# Patient Record
Sex: Male | Born: 1950 | Race: White | Hispanic: No | Marital: Married | Smoking: Never smoker
Health system: Southern US, Community
[De-identification: ages and names within clinical notes are randomized; demographics above are authoritative.]

## PROBLEM LIST (undated history)

## (undated) DIAGNOSIS — I4892 Unspecified atrial flutter: Secondary | ICD-10-CM

---

## 2009-03-10 ENCOUNTER — Ambulatory Visit: Payer: Self-pay | Admitting: Gastroenterology

## 2015-02-24 ENCOUNTER — Ambulatory Visit (INDEPENDENT_AMBULATORY_CARE_PROVIDER_SITE_OTHER): Payer: BLUE CROSS/BLUE SHIELD

## 2015-02-24 ENCOUNTER — Ambulatory Visit (INDEPENDENT_AMBULATORY_CARE_PROVIDER_SITE_OTHER): Payer: BLUE CROSS/BLUE SHIELD | Admitting: Family Medicine

## 2015-02-24 VITALS — BP 130/90 | HR 100 | Temp 99.3°F | Resp 16 | Ht 73.0 in | Wt 240.0 lb

## 2015-02-24 DIAGNOSIS — R062 Wheezing: Secondary | ICD-10-CM | POA: Diagnosis not present

## 2015-02-24 DIAGNOSIS — R0601 Orthopnea: Secondary | ICD-10-CM

## 2015-02-24 DIAGNOSIS — I4891 Unspecified atrial fibrillation: Secondary | ICD-10-CM | POA: Diagnosis not present

## 2015-02-24 DIAGNOSIS — I499 Cardiac arrhythmia, unspecified: Secondary | ICD-10-CM

## 2015-02-24 DIAGNOSIS — R059 Cough, unspecified: Secondary | ICD-10-CM

## 2015-02-24 DIAGNOSIS — R05 Cough: Secondary | ICD-10-CM | POA: Diagnosis not present

## 2015-02-24 DIAGNOSIS — J069 Acute upper respiratory infection, unspecified: Secondary | ICD-10-CM

## 2015-02-24 DIAGNOSIS — J189 Pneumonia, unspecified organism: Secondary | ICD-10-CM | POA: Diagnosis not present

## 2015-02-24 LAB — POCT CBC
Granulocyte percent: 75.1 %G (ref 37–80)
HCT, POC: 46.7 % (ref 43.5–53.7)
HEMOGLOBIN: 15.5 g/dL (ref 14.1–18.1)
Lymph, poc: 1.3 (ref 0.6–3.4)
MCH, POC: 32.2 pg — AB (ref 27–31.2)
MCHC: 33.3 g/dL (ref 31.8–35.4)
MCV: 96.9 fL (ref 80–97)
MID (cbc): 0.3 (ref 0–0.9)
MPV: 6.5 fL (ref 0–99.8)
PLATELET COUNT, POC: 220 10*3/uL (ref 142–424)
POC Granulocyte: 5 (ref 2–6.9)
POC LYMPH PERCENT: 19.7 %L (ref 10–50)
POC MID %: 5.2 % (ref 0–12)
RBC: 4.82 M/uL (ref 4.69–6.13)
RDW, POC: 14.2 %
WBC: 6.6 10*3/uL (ref 4.6–10.2)

## 2015-02-24 MED ORDER — ALBUTEROL SULFATE (2.5 MG/3ML) 0.083% IN NEBU
2.5000 mg | INHALATION_SOLUTION | Freq: Once | RESPIRATORY_TRACT | Status: DC
Start: 2015-02-24 — End: 2015-02-24

## 2015-02-24 MED ORDER — HYDROCODONE-HOMATROPINE 5-1.5 MG/5ML PO SYRP: 5.0000 mL | ORAL_SOLUTION | Freq: Three times a day (TID) | ORAL | Status: DC | PRN

## 2015-02-24 MED ORDER — IPRATROPIUM BROMIDE 0.02 % IN SOLN: 0.5000 mg | Freq: Once | RESPIRATORY_TRACT | Status: DC

## 2015-02-24 MED ORDER — ASPIRIN EC 325 MG PO TBEC: 325.0000 mg | DELAYED_RELEASE_TABLET | Freq: Every day | ORAL | Status: DC

## 2015-02-24 MED ORDER — AZITHROMYCIN 500 MG PO TABS: 500.0000 mg | ORAL_TABLET | Freq: Every day | ORAL | Status: DC

## 2015-02-24 NOTE — Progress Notes (Signed)
Subjective:  This chart was scribed for Chase Sorenson, MD by Charline Bills, ED Scribe. The patient was seen in room 12. Patient's care was started at 8:43 AM.   Patient ID: [name], male    DOB: 1951-03-16, 64 y.o.   MRN: [id number]  Chief Complaint  Patient presents with  . Cough    congestion x 10 days   HPI HPI Comments: Chase Kirk is a 64 y.o. male who presents to the [facility] complaining of persistent cough, both dry and productive, for the past 10 days. Pt reports associated sore throat esp at the beginning of his illness that he describes as "scratchy," in addition to nasal congestion and rhinorrhea with green, yellow and clear discharge and post-nasal drip. However, over the past week he has been having progressive wheezing with lying flat at night and difficulty sleeping due to symptoms. Pt has been treating with Mucinex tablets during the day and liquid at night for the past few days without any significant relief.   He denies fever, chills, loss of appetite, leg swelling, chest pain, palpitations, changes in bowels, urinary symptoms. No h/o asthma, smoking, sleep apnea, respiratory complications, or immunosuppressing illness. Pt states that he was told that he has a heart arrhythmia as a teenager and again just 4-5 years ago when he had a routine screening colonoscopy.  Pt reports that his mother-in-law was recently in the hospital with pseudomonas infection and returned home 3 days ago. Pt does not attribute his symptoms to her illness.     No PCP  Pt wants to minimize any medical interventions.  He would like to get his acute illness treated due to the severity and length of illness w/o improvement but does not want evaluation of any possible chronic medical illness or intervenstions for chronic disease including screening.  Pt was very reluctant to do the EKG I advised but ultimately agreed due to my persistence and concern. Will not take  ANY daily prescription medication on a long-term basis.  No past medical history on file.  No current outpatient prescriptions on file prior to visit.   No current facility-administered medications on file prior to visit.   No Known Allergies  Review of Systems  Constitutional: Positive for fatigue. Negative for fever, chills, activity change and appetite change.  HENT: Positive for congestion, postnasal drip, rhinorrhea and sore throat. Negative for facial swelling, sinus pressure and trouble swallowing.   Respiratory: Positive for shortness of breath and wheezing. Negative for chest tightness.   Cardiovascular: Negative for chest pain, palpitations and leg swelling.  Gastrointestinal: Negative for nausea, vomiting, abdominal pain, diarrhea, constipation and blood in stool.  Genitourinary: Negative.  Negative for decreased urine volume and difficulty urinating.  Skin: Negative for color change and rash.  Neurological: Negative for dizziness, syncope, facial asymmetry, speech difficulty, weakness, light-headedness and numbness.  Hematological: Negative for adenopathy.  Psychiatric/Behavioral: Positive for sleep disturbance. Negative for dysphoric mood. The patient is not nervous/anxious.      BP 130/90 mmHg  Pulse 100  Temp(Src) 99.3 F (37.4 C) (Oral)  Resp 16  Ht  (1.854 m)  Wt 240 lb (108.863 kg)  BMI 31.67 kg/m2  SpO2 98%    Objective:   Physical Exam  Constitutional: He is oriented to person, place, and time. He appears well-developed and well-nourished. No distress.  HENT:  Head: Normocephalic and atraumatic.  Nose: Nose normal.  Mouth/Throat: Posterior oropharyngeal erythema (mild) present.  Ears: cerumen  bilaterally  Mouth/Throat: clear postnasal drip  Eyes: Conjunctivae and EOM are normal.  Neck: Neck supple. No tracheal deviation present. No thyromegaly present.  Cardiovascular: Normal rate, S1 normal and S2 normal.  An irregularly irregular rhythm present.   Pulmonary/Chest: Effort normal and breath sounds normal. No respiratory distress. He has no wheezes.  Lungs are clear to auscultation. Good air movement. No wheezing with forced expiration.   Musculoskeletal: Normal range of motion. He exhibits no edema.  Lymphadenopathy:    He has no cervical adenopathy.  Neurological: He is alert and oriented to person, place, and time.  Skin: Skin is warm and dry.  Psychiatric: He has a normal mood and affect. His behavior is normal.  Nursing note and vitals reviewed.    [facility] reading (PRIMARY) by  Dr. Clelia Croft.  CXR: LLL infiltrate consistent with pneumonia. Borderline enlarged cardiac silhouette, question of some thoracic scoliosis Dg Chest 2 View  02/24/2015   CLINICAL DATA:  Ten days of chest congestion, history of atrial fibrillation and orthopnea, nonsmoker.  EXAM: CHEST  2 VIEW  COMPARISON:  None.  FINDINGS: The lungs are mildly hyperinflated but clear. The cardiac silhouette is enlarged. The pulmonary vascularity is normal. There is no pleural effusion or pneumothorax. The mediastinum is normal in width. The bony thorax is unremarkable.  IMPRESSION: COPD. There is no pneumonia nor other acute cardiopulmonary abnormality.   Electronically Signed   By: David  [place] M.D.   On: 02/24/2015 10:17    Results for orders placed or performed in visit on 02/24/15  POCT CBC  Result Value Ref Range   WBC 6.6 4.6 - 10.2 K/uL   Lymph, poc 1.3 0.6 - 3.4   POC LYMPH PERCENT 19.7 10 - 50 %L   MID (cbc) 0.3 0 - 0.9   POC MID % 5.2 0 - 12 %M   POC Granulocyte 5.0 2 - 6.9   Granulocyte percent 75.1 37 - 80 %G   RBC 4.82 4.69 - 6.13 M/uL   Hemoglobin 15.5 14.1 - 18.1 g/dL   HCT, POC 16.1 09.6 - 53.7 %   MCV 96.9 80 - 97 fL   MCH, POC 32.2 (A) 27 - 31.2 pg   MCHC 33.3 31.8 - 35.4 g/dL   RDW, POC 04.5 %   Platelet Count, POC 220 142 - 424 K/uL   MPV 6.5 0 - 99.8 fL    Assessment & Plan:   1. Cardiac arrhythmia, unspecified cardiac arrhythmia type   2. Wheeze    3. Cough   4. Acute upper respiratory infection   5. Atrial fibrillation, unspecified   6. Orthopnea   7. CAP (community acquired pneumonia)     Orders Placed This Encounter  Procedures  . DG Chest 2 View    Standing Status: Future     Number of Occurrences: 1     Standing Expiration Date: 03/03/2015    Order Specific Question:  Reason for Exam (SYMPTOM  OR DIAGNOSIS REQUIRED)    Answer:  new DOE on amiodarone    Order Specific Question:  Preferred imaging location?    Answer:  External  . POCT CBC  . EKG 12-Lead    Meds ordered this encounter  Medications  . DISCONTD: albuterol (PROVENTIL) (2.5 MG/3ML) 0.083% nebulizer solution 2.5 mg    Sig:   . DISCONTD: ipratropium (ATROVENT) nebulizer solution 0.5 mg    Sig:   . aspirin EC 325 MG tablet    Sig: Take 1 tablet (325 mg total)  by mouth daily.    Dispense:  325 tablet    Refill:  1  . azithromycin (ZITHROMAX) 500 MG tablet    Sig: Take 1 tablet (500 mg total) by mouth daily.    Dispense:  3 tablet    Refill:  0  . HYDROcodone-homatropine (HYCODAN) 5-1.5 MG/5ML syrup    Sig: Take 5 mLs by mouth every 8 (eight) hours as needed for cough.    Dispense:  120 mL    Refill:  0    Pt does not want to take daily prescription medications. I have fully informed him of risks of untreated atrial fibrillation including possible stroke with its variety of symptoms including dysphagia, hemiparesis, loss of bowel and bladder function, inability to communicate and death. Advised pt that this could be triggered by acute illness and use of cough and cold medications which would be of even more danger to him as result of illness and higher risk of paroxysmal atrial fibrillation to cause stroke. Pt absolutely refuses to take any daily prescription medications, even a very lose dose o fa once daily beta blocker which many patients have no side effects for and does not require any extra monitoring. Pt does consent to starting ASA daily but under no  conditions would consider prescription anticoagulation. He will not consider special consultation or outpatient cardiology evaluation. I explained that EKG also shows R bundle branch which leaves us unable to access current ischemia or history thereof. 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 He still asks that acute infection be treated.   I personally performed the services described in this documentation, which was scribed in my presence. The recorded information has been reviewed and considered, and addended by me as needed.  Chase SorensonEva Wylee Dorantes, MD MPH

## 2015-02-24 NOTE — Patient Instructions (Addendum)
You should stay away from all cough and cold medications containing decongestant, especially phenylephrine and pseudoephedrine (will be listed under the active ingredient list).    Atrial Fibrillation Atrial fibrillation is a type of irregular heart rhythm (arrhythmia). During atrial fibrillation, the upper chambers of the heart (atria) quiver continuously in a chaotic pattern. This causes an irregular and often rapid heart rate.  Atrial fibrillation is the result of the heart becoming overloaded with disorganized signals that tell it to beat. These signals are normally released one at a time by a part of the right atrium called the sinoatrial node. They then travel from the atria to the lower chambers of the heart (ventricles), causing the atria and ventricles to contract and pump blood as they pass. In atrial fibrillation, parts of the atria outside of the sinoatrial node also release these signals. This results in two problems. First, the atria receive so many signals that they do not have time to fully contract. Second, the ventricles, which can only receive one signal at a time, beat irregularly and out of rhythm with the atria.  There are three types of atrial fibrillation:   Paroxysmal. Paroxysmal atrial fibrillation starts suddenly and stops on its own within a week.  Persistent. Persistent atrial fibrillation lasts for more than a week. It may stop on its own or with treatment.  Permanent. Permanent atrial fibrillation does not go away. Episodes of atrial fibrillation may lead to permanent atrial fibrillation. Atrial fibrillation can prevent your heart from pumping blood normally. It increases your risk of stroke and can lead to heart failure.  CAUSES   Heart conditions, including a heart attack, heart failure, coronary artery disease, and heart valve conditions.   Inflammation of the sac that surrounds the heart (pericarditis).  Blockage of an artery in the lungs (pulmonary  embolism).  Pneumonia or other infections.  Chronic lung disease.  Thyroid problems, especially if the thyroid is overactive (hyperthyroidism).  Caffeine, excessive alcohol use, and use of some illegal drugs.   Use of some medicines, including certain decongestants and diet pills.  Heart surgery.   Birth defects.  Sometimes, no cause can be found. When this happens, the atrial fibrillation is called lone atrial fibrillation. The risk of complications from atrial fibrillation increases if you have lone atrial fibrillation and you are age 10 years or older. RISK FACTORS  Heart failure.  Coronary artery disease.  Diabetes mellitus.   High blood pressure (hypertension).   Obesity.   Other arrhythmias.   Increased age. SIGNS AND SYMPTOMS   A feeling that your heart is beating rapidly or irregularly.   A feeling of discomfort or pain in your chest.   Shortness of breath.   Sudden light-headedness or weakness.   Getting tired easily when exercising.   Urinating more often than normal (mainly when atrial fibrillation first begins).  In paroxysmal atrial fibrillation, symptoms may start and suddenly stop. DIAGNOSIS  Your health care provider may be able to detect atrial fibrillation when taking your pulse. Your health care provider may have you take a test called an ambulatory electrocardiogram (ECG). An ECG records your heartbeat patterns over a 24-hour period. You may also have other tests, such as:  Transthoracic echocardiogram (TTE). During echocardiography, sound waves are used to evaluate how blood flows through your heart.  Transesophageal echocardiogram (TEE).  Stress test. There is more than one type of stress test. If a stress test is needed, ask your health care provider about which type is best  for you.  Chest X-ray exam.  Blood tests.  Computed tomography (CT). TREATMENT  Treatment may include:  Treating any underlying conditions. For  example, if you have an overactive thyroid, treating the condition may correct atrial fibrillation.  Taking medicine. Medicines may be given to control a rapid heart rate or to prevent blood clots, heart failure, or a stroke.  Having a procedure to correct the rhythm of the heart:  Electrical cardioversion. During electrical cardioversion, a controlled, low-energy shock is delivered to the heart through your skin. If you have chest pain, very low blood pressure, or sudden heart failure, this procedure may need to be done as an emergency.  Catheter ablation. During this procedure, heart tissues that send the signals that cause atrial fibrillation are destroyed.  Surgical ablation. During this surgery, thin lines of heart tissue that carry the abnormal signals are destroyed. This procedure can either be an open-heart surgery or a minimally invasive surgery. With the minimally invasive surgery, small cuts are made to access the heart instead of a large opening.  Pulmonary venous isolation. During this surgery, tissue around the veins that carry blood from the lungs (pulmonary veins) is destroyed. This tissue is thought to carry the abnormal signals. HOME CARE INSTRUCTIONS   Take medicines only as directed by your health care provider. Some medicines can make atrial fibrillation worse or recur.  If blood thinners were prescribed by your health care provider, take them exactly as directed. Too much blood-thinning medicine can cause bleeding. If you take too little, you will not have the needed protection against stroke and other problems.  Perform blood tests at home if directed by your health care provider. Perform blood tests exactly as directed.  Quit smoking if you smoke.  Do not drink alcohol.  Do not drink caffeinated beverages such as coffee, soda, and some teas. You may drink decaffeinated coffee, soda, or tea.   Maintain a healthy weight.Do not use diet pills unless your health care  provider approves. They may make heart problems worse.   Follow diet instructions as directed by your health care provider.  Exercise regularly as directed by your health care provider.  Keep all follow-up visits as directed by your health care provider. This is important. PREVENTION  The following substances can cause atrial fibrillation to recur:   Caffeinated beverages.  Alcohol.  Certain medicines, especially those used for breathing problems.  Certain herbs and herbal medicines, such as those containing ephedra or ginseng.  Illegal drugs, such as cocaine and amphetamines. Sometimes medicines are given to prevent atrial fibrillation from recurring. Proper treatment of any underlying condition is also important in helping prevent recurrence.  SEEK MEDICAL CARE IF:  You notice a change in the rate, rhythm, or strength of your heartbeat.  You suddenly begin urinating more frequently.  You tire more easily when exerting yourself or exercising. SEEK IMMEDIATE MEDICAL CARE IF:   You have chest pain, abdominal pain, sweating, or weakness.  You feel nauseous.  You have shortness of breath.  You suddenly have swollen feet and ankles.  You feel dizzy.  Your face or limbs feel numb or weak.  You have a change in your vision or speech. MAKE SURE YOU:   Understand these instructions.  Will watch your condition.  Will get help right away if you are not doing well or get worse. Document Released: 09/12/2005 Document Revised: 01/27/2014 Document Reviewed: 10/23/2012 [facility] Patient Information 2015 [place], [place]. This information is not intended to replace advice  given to you by your health care provider. Make sure you discuss any questions you have with your health care provider. Pneumonia Pneumonia is an infection of the lungs.  CAUSES Pneumonia may be caused by bacteria or a virus. Usually, these infections are caused by breathing infectious particles into the lungs  (respiratory tract). SIGNS AND SYMPTOMS   Cough.  Fever.  Chest pain.  Increased rate of breathing.  Wheezing.  Mucus production. DIAGNOSIS  If you have the common symptoms of pneumonia, your health care provider will typically confirm the diagnosis with a chest X-ray. The X-ray will show an abnormality in the lung (pulmonary infiltrate) if you have pneumonia. Other tests of your blood, urine, or sputum may be done to find the specific cause of your pneumonia. Your health care provider may also do tests (blood gases or pulse oximetry) to see how well your lungs are working. TREATMENT  Some forms of pneumonia may be spread to other people when you cough or sneeze. You may be asked to wear a mask before and during your exam. Pneumonia that is caused by bacteria is treated with antibiotic medicine. Pneumonia that is caused by the influenza virus may be treated with an antiviral medicine. Most other viral infections must run their course. These infections will not respond to antibiotics.  HOME CARE INSTRUCTIONS   Cough suppressants may be used if you are losing too much rest. However, coughing protects you by clearing your lungs. You should avoid using cough suppressants if you can.  Your health care provider may have prescribed medicine if he or she thinks your pneumonia is caused by bacteria or influenza. Finish your medicine even if you start to feel better.  Your health care provider may also prescribe an expectorant. This loosens the mucus to be coughed up.  Take medicines only as directed by your health care provider.  Do not smoke. Smoking is a common cause of bronchitis and can contribute to pneumonia. If you are a smoker and continue to smoke, your cough may last several weeks after your pneumonia has cleared.  A cold steam vaporizer or humidifier in your room or home may help loosen mucus.  Coughing is often worse at night. Sleeping in a semi-upright position in a recliner or  using a couple pillows under your head will help with this.  Get rest as you feel it is needed. Your body will usually let you know when you need to rest. PREVENTION A pneumococcal shot (vaccine) is available to prevent a common bacterial cause of pneumonia. This is usually suggested for:  People over 52 years old.  Patients on chemotherapy.  People with chronic lung problems, such as bronchitis or emphysema.  People with immune system problems. If you are over 65 or have a high risk condition, you may receive the pneumococcal vaccine if you have not received it before. In some countries, a routine influenza vaccine is also recommended. This vaccine can help prevent some cases of pneumonia.You may be offered the influenza vaccine as part of your care. If you smoke, it is time to quit. You may receive instructions on how to stop smoking. Your health care provider can provide medicines and counseling to help you quit. SEEK MEDICAL CARE IF: You have a fever. SEEK IMMEDIATE MEDICAL CARE IF:   Your illness becomes worse. This is especially true if you are elderly or weakened from any other disease.  You cannot control your cough with suppressants and are losing sleep.  You  begin coughing up blood.  You develop pain which is getting worse or is uncontrolled with medicines.  Any of the symptoms which initially brought you in for treatment are getting worse rather than better.  You develop shortness of breath or chest pain. MAKE SURE YOU:   Understand these instructions.  Will watch your condition.  Will get help right away if you are not doing well or get worse. Document Released: 09/12/2005 Document Revised: 01/27/2014 Document Reviewed: 12/02/2010 [facility] Patient Information 2015 [place], [place]. This information is not intended to replace advice given to you by your health care provider. Make sure you discuss any questions you have with your health care provider.

## 2015-12-20 ENCOUNTER — Ambulatory Visit (INDEPENDENT_AMBULATORY_CARE_PROVIDER_SITE_OTHER): Payer: PPO

## 2015-12-20 ENCOUNTER — Ambulatory Visit (INDEPENDENT_AMBULATORY_CARE_PROVIDER_SITE_OTHER): Payer: PPO | Admitting: Family Medicine

## 2015-12-20 VITALS — BP 120/68 | HR 112 | Temp 99.6°F | Resp 16 | Ht 73.0 in | Wt 240.0 lb

## 2015-12-20 DIAGNOSIS — R05 Cough: Secondary | ICD-10-CM

## 2015-12-20 DIAGNOSIS — R0989 Other specified symptoms and signs involving the circulatory and respiratory systems: Secondary | ICD-10-CM

## 2015-12-20 DIAGNOSIS — R5383 Other fatigue: Secondary | ICD-10-CM | POA: Diagnosis not present

## 2015-12-20 DIAGNOSIS — R059 Cough, unspecified: Secondary | ICD-10-CM

## 2015-12-20 DIAGNOSIS — J988 Other specified respiratory disorders: Secondary | ICD-10-CM | POA: Diagnosis not present

## 2015-12-20 DIAGNOSIS — R070 Pain in throat: Secondary | ICD-10-CM | POA: Diagnosis not present

## 2015-12-20 DIAGNOSIS — I482 Chronic atrial fibrillation, unspecified: Secondary | ICD-10-CM

## 2015-12-20 DIAGNOSIS — J22 Unspecified acute lower respiratory infection: Secondary | ICD-10-CM

## 2015-12-20 LAB — POCT INFLUENZA A/B
INFLUENZA A, POC: NEGATIVE
INFLUENZA B, POC: NEGATIVE

## 2015-12-20 MED ORDER — AZITHROMYCIN 250 MG PO TABS: ORAL_TABLET | ORAL | Status: AC

## 2015-12-20 NOTE — Patient Instructions (Addendum)
IF you received an x-ray today, you will receive an invoice from [facility]. Please contact [facility] at [phone] with questions or concerns regarding your invoice.   IF you received labwork today, you will receive an invoice from [facility]. Please contact [facility] at [phone] with questions or concerns regarding your invoice.   Our billing staff will not be able to assist you with questions regarding bills from these companies.  You will be contacted with the lab results as soon as they are available. The fastest way to get your results is to activate your My Chart account. Instructions are located on the last page of this paperwork. If you have not heard from us regarding the results in 2 weeks, please contact this office.    The radiologist did not see a pneumonia at this time, but did, again comment on hyperinflation or possible COPD. If you do have chronic cough or recurrent bronchitis, would recommend evaluation through pulmonary to see if you do have COPD or if other medications are needed.   In addition if you change your mind about workup of the abnormal EKG from last year, or discussion of other recommended medicines for atrial fibrillation, let me know and I'll be happy to refer you to a cardiologist.  Your flu test was negative, but this test is not 100% accurate. You still could have a viral illness or influenza, but with the congestion on your exam, will go ahead and treat for possible early bronchitis or pneumonia. Mucinex or Mucinex DM as needed for cough. Return to the clinic or go to the nearest emergency room if any of your symptoms worsen or new symptoms occur.   Cough, Adult Coughing is a reflex that clears your throat and your airways. Coughing helps to heal and protect your lungs. It is normal to cough occasionally, but a cough that happens with other symptoms or lasts a long time may be a sign of a condition  that needs treatment. A cough may last only 2-3 weeks (acute), or it may last longer than 8 weeks (chronic). CAUSES Coughing is commonly caused by:  Breathing in substances that irritate your lungs.  A viral or bacterial respiratory infection.  Allergies.  Asthma.  Postnasal drip.  Smoking.  Acid backing up from the stomach into the esophagus (gastroesophageal reflux).  Certain medicines.  Chronic lung problems, including COPD (or rarely, lung cancer).  Other medical conditions such as heart failure. HOME CARE INSTRUCTIONS  Pay attention to any changes in your symptoms. Take these actions to help with your discomfort:  Take medicines only as told by your health care provider.  If you were prescribed an antibiotic medicine, take it as told by your health care provider. Do not stop taking the antibiotic even if you start to feel better.  Talk with your health care provider before you take a cough suppressant medicine.  Drink enough fluid to keep your urine clear or pale yellow.  If the air is dry, use a cold steam vaporizer or humidifier in your bedroom or your home to help loosen secretions.  Avoid anything that causes you to cough at work or at home.  If your cough is worse at night, try sleeping in a semi-upright position.  Avoid cigarette smoke. If you smoke, quit smoking. If you need help quitting, ask your health care provider.  Avoid caffeine.  Avoid alcohol.  Rest as needed. SEEK MEDICAL CARE IF:   You have new  symptoms.  You cough up pus.  Your cough does not get better after 2-3 weeks, or your cough gets worse.  You cannot control your cough with suppressant medicines and you are losing sleep.  You develop pain that is getting worse or pain that is not controlled with pain medicines.  You have a fever.  You have unexplained weight loss.  You have night sweats. SEEK IMMEDIATE MEDICAL CARE IF:  You cough up blood.  You have difficulty  breathing.  Your heartbeat is very fast.   This information is not intended to replace advice given to you by your health care provider. Make sure you discuss any questions you have with your health care provider.   Document Released: 03/11/2011 Document Revised: 06/03/2015 Document Reviewed: 11/19/2014 Elsevier Interactive Patient Education 2016 [employer]  Atrial Fibrillation Atrial fibrillation is a type of irregular or rapid heartbeat (arrhythmia). In atrial fibrillation, the heart quivers continuously in a chaotic pattern. This occurs when parts of the heart receive disorganized signals that make the heart unable to pump blood normally. This can increase the risk for stroke, heart failure, and other heart-related conditions. There are different types of atrial fibrillation, including:  Paroxysmal atrial fibrillation. This type starts suddenly, and it usually stops on its own shortly after it starts.  Persistent atrial fibrillation. This type often lasts longer than a week. It may stop on its own or with treatment.  Long-lasting persistent atrial fibrillation. This type lasts longer than 12 months.  Permanent atrial fibrillation. This type does not go away. Talk with your health care provider to learn about the type of atrial fibrillation that you have. CAUSES This condition is caused by some heart-related conditions or procedures, including:  A heart attack.  Coronary artery disease.  Heart failure.  Heart valve conditions.  High blood pressure.  Inflammation of the sac that surrounds the heart (pericarditis).  Heart surgery.  Certain heart rhythm disorders, such as Wolf-Parkinson-White syndrome. Other causes include:  Pneumonia.  Obstructive sleep apnea.  Blockage of an artery in the lungs (pulmonary embolism, or PE).  Lung cancer.  Chronic lung disease.  Thyroid problems, especially if the thyroid is overactive (hyperthyroidism).  Caffeine.  Excessive  alcohol use or illegal drug use.  Use of some medicines, including certain decongestants and diet pills. Sometimes, the cause cannot be found. RISK FACTORS This condition is more likely to develop in:  People who are older in age.  People who smoke.  People who have diabetes mellitus.  People who are overweight (obese).  Athletes who exercise vigorously. SYMPTOMS Symptoms of this condition include:  A feeling that your heart is beating rapidly or irregularly.  A feeling of discomfort or pain in your chest.  Shortness of breath.  Sudden light-headedness or weakness.  Getting tired easily during exercise. In some cases, there are no symptoms. DIAGNOSIS Your health care provider may be able to detect atrial fibrillation when taking your pulse. If detected, this condition may be diagnosed with:  An electrocardiogram (ECG).  A Holter monitor test that records your heartbeat patterns over a 24-hour period.  Transthoracic echocardiogram (TTE) to evaluate how blood flows through your heart.  Transesophageal echocardiogram (TEE) to view more detailed images of your heart.  A stress test.  Imaging tests, such as a CT scan or chest X-ray.  Blood tests. TREATMENT The main goals of treatment are to prevent blood clots from forming and to keep your heart beating at a normal rate and rhythm. The type  of treatment that you receive depends on many factors, such as your underlying medical conditions and how you feel when you are experiencing atrial fibrillation. This condition may be treated with:  Medicine to slow down the heart rate, bring the heart's rhythm back to normal, or prevent clots from forming.  Electrical cardioversion. This is a procedure that resets your heart's rhythm by delivering a controlled, low-energy shock to the heart through your skin.  Different types of ablation, such as catheter ablation, catheter ablation with pacemaker, or surgical ablation. These  procedures destroy the heart tissues that send abnormal signals. When the pacemaker is used, it is placed under your skin to help your heart beat in a regular rhythm. HOME CARE INSTRUCTIONS  Take over-the counter and prescription medicines only as told by your health care provider.  If your health care provider prescribed a blood-thinning medicine (anticoagulant), take it exactly as told. Taking too much blood-thinning medicine can cause bleeding. If you do not take enough blood-thinning medicine, you will not have the protection that you need against stroke and other problems.  Do not use tobacco products, including cigarettes, chewing tobacco, and e-cigarettes. If you need help quitting, ask your health care provider.  If you have obstructive sleep apnea, manage your condition as told by your health care provider.  Do not drink alcohol.  Do not drink beverages that contain caffeine, such as coffee, soda, and tea.  Maintain a healthy weight. Do not use diet pills unless your health care provider approves. Diet pills may make heart problems worse.  Follow diet instructions as told by your health care provider.  Exercise regularly as told by your health care provider.  Keep all follow-up visits as told by your health care provider. This is important. PREVENTION  Avoid drinking beverages that contain caffeine or alcohol.  Avoid certain medicines, especially medicines that are used for breathing problems.  Avoid certain herbs and herbal medicines, such as those that contain ephedra or ginseng.  Do not use illegal drugs, such as cocaine and amphetamines.  Do not smoke.  Manage your high blood pressure. SEEK MEDICAL CARE IF:  You notice a change in the rate, rhythm, or strength of your heartbeat.  You are taking an anticoagulant and you notice increased bruising.  You tire more easily when you exercise or exert yourself. SEEK IMMEDIATE MEDICAL CARE IF:  You have chest pain,  abdominal pain, sweating, or weakness.  You feel nauseous.  You notice blood in your vomit, bowel movement, or urine.  You have shortness of breath.  You suddenly have swollen feet and ankles.  You feel dizzy.  You have sudden weakness or numbness of the face, arm, or leg, especially on one side of the body.  You have trouble speaking, trouble understanding, or both (aphasia).  Your face or your eyelid droops on one side. These symptoms may represent a serious problem that is an emergency. Do not wait to see if the symptoms will go away. Get medical help right away. Call your local emergency services (911 in the [place] Do not drive yourself to the hospital.   This information is not intended to replace advice given to you by your health care provider. Make sure you discuss any questions you have with your health care provider.   Document Released: 09/12/2005 Document Revised: 06/03/2015 Document Reviewed: 01/07/2015 Elsevier Interactive Patient Education [employer]

## 2015-12-20 NOTE — Progress Notes (Addendum)
Subjective:    Patient ID: Chase Kirk, male    DOB: 12/29/1950, 65 y.o.   MRN: [id number] By signing my name below, I, Chase Kirk, attest that this documentation has been prepared under the direction and in the presence of Chase StaggersJeffrey Damiya Sandefur, MD.  Electronically Signed: Littie Deedsichard Kirk, Medical Scribe. 12/20/2015. 11:59 AM.  HPI HPI Comments: Chase Kirk is a 65 y.o. male with a history of A Fib who presents to the [facility] complaining of gradual onset cough that started 2 days ago. His symptoms began with throat scratchiness that began in the late morning 2 days ago. Later that night, he developed a cough. The cough became productive the following day. Patient reports having associated fatigue and chest congestion. He has not tried any medications. He does not use an inhaler and is not on any regular prescription medications, but he does take a baby aspirin daily. Patient denies fever, headache, and myalgias. He denies history of smoking, but his father was a smoker at home and many of his friends had smoked. He did not receive the flu shot this year. He notes that he has had pneumonia 3-4 times in the past 20 years, and he was seen here last year for pneumonia. Patient has had multiple sick contacts at work, and his wife has also been ill. He is still against taking medications for atrial fibrillation. He does not want to see a cardiologist.  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  There are no active problems to display for this patient.  History reviewed. No pertinent past medical history. History reviewed. No pertinent past surgical history. No Known Allergies Prior to Admission medications   Medication Sig Start Date End Date Taking? Authorizing Provider  aspirin 81 MG tablet Take 81 mg by mouth daily.   Yes Historical Provider, MD   Social History   Social History  . Marital Status: Married    Spouse Name: N/A  . Number of Children: N/A  . Years of  Education: N/A   Occupational History  . Not on file.   Social History Main Topics  . Smoking status: Never Smoker   . Smokeless tobacco: Never Used  . Alcohol Use: No  . Drug Use: No  . Sexual Activity: Not on file   Other Topics Concern  . Not on file   Social History Narrative     Review of Systems  Constitutional: Positive for fatigue. Negative for fever.  HENT: Positive for congestion and sore throat.   Respiratory: Positive for cough.   Musculoskeletal: Negative for myalgias.  Neurological: Negative for headaches.       Objective:   Physical Exam  Constitutional: He is oriented to person, place, and time. He appears well-developed and well-nourished.  HENT:  Head: Normocephalic and atraumatic.  Right Ear: Tympanic membrane, external ear and ear canal normal.  Left Ear: Tympanic membrane, external ear and ear canal normal.  Nose: No rhinorrhea.  Mouth/Throat: Oropharynx is clear and moist and mucous membranes are normal. No oropharyngeal exudate or posterior oropharyngeal erythema.  Eyes: Conjunctivae are normal. Pupils are equal, round, and reactive to light.  Neck: Neck supple.  Cardiovascular: Normal rate, normal heart sounds and intact distal pulses.  An irregularly irregular rhythm present.  No murmur heard. Pulmonary/Chest: Effort normal. He has no rhonchi.  Few distant coarse breath sounds, right lower lobe.  Abdominal: Soft. There is no tenderness.  Lymphadenopathy:    He has no cervical adenopathy.  Neurological: He is alert and oriented to person, place, and time.  Skin: Skin is warm and dry. No rash noted.  Psychiatric: He has a normal mood and affect. His behavior is normal.  Vitals reviewed.   Filed Vitals:   12/20/15 1121  BP: 120/68  Pulse: 112  Temp: 99.6 F (37.6 C)  TempSrc: Oral  Resp: 16  Height:  (1.854 m)  Weight: 240 lb (108.863 kg)  SpO2: 97%    Results for orders placed or performed in visit on 12/20/15  POCT Influenza  A/B  Result Value Ref Range   Influenza A, POC Negative Negative   Influenza B, POC Negative Negative   Dg Chest 2 View  12/20/2015  CLINICAL DATA:  65 year old male with cough and congestion for 2 days. Right lower lobe abnormal pulmonary auscultation. Initial encounter. EXAM: CHEST  2 VIEW COMPARISON:  02/24/2015. FINDINGS: Lung volumes at the upper limits of normal to mildly hyperinflated. Cardiac size is at the upper limits of normal to mildly increased. Other mediastinal contours are normal. Visualized tracheal air column is within normal limits. No pneumothorax, pulmonary edema, pleural effusion or confluent pulmonary opacity. Chronic left lateral seventh through ninth rib fractures. No acute osseous abnormality identified. IMPRESSION: No acute cardiopulmonary abnormality. A degree of chronic pulmonary hyperinflation is suspected. Borderline to mild cardiomegaly. Electronically Signed   By: Odessa Fleming M.D.   On: 12/20/2015 12:41        Assessment & Plan:   Chase Kirk is a 65 y.o. male Cough - Plan: POCT Influenza A/B, DG Chest 2 View, azithromycin (ZITHROMAX) 250 MG tablet LRTI (lower respiratory tract infection) - Plan: azithromycin (ZITHROMAX) 250 MG tablet  - suspected early pneumonia based on exam.   - start Zpak, other symptomatic care as in AVS, and RTC precautions if worsening.   Chronic atrial fibrillation (HCC)  -again declined referral to cardiology to discuss other treatment or to review options for other anticoagulation now that he is 65yo.  Advised to let me know if he changes his mind and can refer him to cardiology at any time.   Hyperinflation of lungs  -no known hx of COPD, but second hand smoke exposure prior. If persisitent cough, dyspnea, or recurrent bronchitis - recommend pulm eval or spirometry.    Meds ordered this encounter  Medications  . aspirin 81 MG tablet    Sig: Take 81 mg by mouth daily.  Marland Kitchen azithromycin (ZITHROMAX) 250 MG tablet     Sig: Take 2 pills by mouth on day 1, then 1 pill by mouth per day on days 2 through 5.    Dispense:  6 tablet    Refill:  0   Patient Instructions       IF you received an x-ray today, you will receive an invoice from [facility]. Please contact [facility] at [phone] with questions or concerns regarding your invoice.   IF you received labwork today, you will receive an invoice from [employer]. Please contact [facility] at [phone] with questions or concerns regarding your invoice.   Our billing staff will not be able to assist you with questions regarding bills from these companies.  You will be contacted with the lab results as soon as they are available. The fastest way to get your results is to activate your My Chart account. Instructions are located on the last page of this paperwork. If you have not heard from [place] regarding the results in 2  weeks, please contact this office.    The radiologist did not see a pneumonia at this time, but did, again comment on hyperinflation or possible COPD. If you do have chronic cough or recurrent bronchitis, would recommend evaluation through pulmonary to see if you do have COPD or if other medications are needed.   In addition if you change your mind about workup of the abnormal EKG from last year, or discussion of other recommended medicines for atrial fibrillation, let me know and I'll be happy to refer you to a cardiologist.  Your flu test was negative, but this test is not 100% accurate. You still could have a viral illness or influenza, but with the congestion on your exam, will go ahead and treat for possible early bronchitis or pneumonia. Mucinex or Mucinex DM as needed for cough. Return to the clinic or go to the nearest emergency room if any of your symptoms worsen or new symptoms occur.   Cough, Adult Coughing is a reflex that clears your throat and your airways. Coughing helps to heal and  protect your lungs. It is normal to cough occasionally, but a cough that happens with other symptoms or lasts a long time may be a sign of a condition that needs treatment. A cough may last only 2-3 weeks (acute), or it may last longer than 8 weeks (chronic). CAUSES Coughing is commonly caused by:  Breathing in substances that irritate your lungs.  A viral or bacterial respiratory infection.  Allergies.  Asthma.  Postnasal drip.  Smoking.  Acid backing up from the stomach into the esophagus (gastroesophageal reflux).  Certain medicines.  Chronic lung problems, including COPD (or rarely, lung cancer).  Other medical conditions such as heart failure. HOME CARE INSTRUCTIONS  Pay attention to any changes in your symptoms. Take these actions to help with your discomfort:  Take medicines only as told by your health care provider.  If you were prescribed an antibiotic medicine, take it as told by your health care provider. Do not stop taking the antibiotic even if you start to feel better.  Talk with your health care provider before you take a cough suppressant medicine.  Drink enough fluid to keep your urine clear or pale yellow.  If the air is dry, use a cold steam vaporizer or humidifier in your bedroom or your home to help loosen secretions.  Avoid anything that causes you to cough at work or at home.  If your cough is worse at night, try sleeping in a semi-upright position.  Avoid cigarette smoke. If you smoke, quit smoking. If you need help quitting, ask your health care provider.  Avoid caffeine.  Avoid alcohol.  Rest as needed. SEEK MEDICAL CARE IF:   You have new symptoms.  You cough up pus.  Your cough does not get better after 2-3 weeks, or your cough gets worse.  You cannot control your cough with suppressant medicines and you are losing sleep.  You develop pain that is getting worse or pain that is not controlled with pain medicines.  You have a  fever.  You have unexplained weight loss.  You have night sweats. SEEK IMMEDIATE MEDICAL CARE IF:  You cough up blood.  You have difficulty breathing.  Your heartbeat is very fast.   This information is not intended to replace advice given to you by your health care provider. Make sure you discuss any questions you have with your health care provider.   Document Released: 03/11/2011 Document Revised: 06/03/2015  Document Reviewed: 11/19/2014 Elsevier Interactive Patient Education 2016 Elsevier Inc.  Atrial Fibrillation Atrial fibrillation is a type of irregular or rapid heartbeat (arrhythmia). In atrial fibrillation, the heart quivers continuously in a chaotic pattern. This occurs when parts of the heart receive disorganized signals that make the heart unable to pump blood normally. This can increase the risk for stroke, heart failure, and other heart-related conditions. There are different types of atrial fibrillation, including:  Paroxysmal atrial fibrillation. This type starts suddenly, and it usually stops on its own shortly after it starts.  Persistent atrial fibrillation. This type often lasts longer than a week. It may stop on its own or with treatment.  Long-lasting persistent atrial fibrillation. This type lasts longer than 12 months.  Permanent atrial fibrillation. This type does not go away. Talk with your health care provider to learn about the type of atrial fibrillation that you have. CAUSES This condition is caused by some heart-related conditions or procedures, including:  A heart attack.  Coronary artery disease.  Heart failure.  Heart valve conditions.  High blood pressure.  Inflammation of the sac that surrounds the heart (pericarditis).  Heart surgery.  Certain heart rhythm disorders, such as Wolf-Parkinson-White syndrome. Other causes include:  Pneumonia.  Obstructive sleep apnea.  Blockage of an artery in the lungs (pulmonary embolism, or  PE).  Lung cancer.  Chronic lung disease.  Thyroid problems, especially if the thyroid is overactive (hyperthyroidism).  Caffeine.  Excessive alcohol use or illegal drug use.  Use of some medicines, including certain decongestants and diet pills. Sometimes, the cause cannot be found. RISK FACTORS This condition is more likely to develop in:  People who are older in age.  People who smoke.  People who have diabetes mellitus.  People who are overweight (obese).  Athletes who exercise vigorously. SYMPTOMS Symptoms of this condition include:  A feeling that your heart is beating rapidly or irregularly.  A feeling of discomfort or pain in your chest.  Shortness of breath.  Sudden light-headedness or weakness.  Getting tired easily during exercise. In some cases, there are no symptoms. DIAGNOSIS Your health care provider may be able to detect atrial fibrillation when taking your pulse. If detected, this condition may be diagnosed with:  An electrocardiogram (ECG).  A Holter monitor test that records your heartbeat patterns over a 24-hour period.  Transthoracic echocardiogram (TTE) to evaluate how blood flows through your heart.  Transesophageal echocardiogram (TEE) to view more detailed images of your heart.  A stress test.  Imaging tests, such as a CT scan or chest X-ray.  Blood tests. TREATMENT The main goals of treatment are to prevent blood clots from forming and to keep your heart beating at a normal rate and rhythm. The type of treatment that you receive depends on many factors, such as your underlying medical conditions and how you feel when you are experiencing atrial fibrillation. This condition may be treated with:  Medicine to slow down the heart rate, bring the heart's rhythm back to normal, or prevent clots from forming.  Electrical cardioversion. This is a procedure that resets your heart's rhythm by delivering a controlled, low-energy shock to the  heart through your skin.  Different types of ablation, such as catheter ablation, catheter ablation with pacemaker, or surgical ablation. These procedures destroy the heart tissues that send abnormal signals. When the pacemaker is used, it is placed under your skin to help your heart beat in a regular rhythm. HOME CARE INSTRUCTIONS  Take over-the counter  and prescription medicines only as told by your health care provider.  If your health care provider prescribed a blood-thinning medicine (anticoagulant), take it exactly as told. Taking too much blood-thinning medicine can cause bleeding. If you do not take enough blood-thinning medicine, you will not have the protection that you need against stroke and other problems.  Do not use tobacco products, including cigarettes, chewing tobacco, and e-cigarettes. If you need help quitting, ask your health care provider.  If you have obstructive sleep apnea, manage your condition as told by your health care provider.  Do not drink alcohol.  Do not drink beverages that contain caffeine, such as coffee, soda, and tea.  Maintain a healthy weight. Do not use diet pills unless your health care provider approves. Diet pills may make heart problems worse.  Follow diet instructions as told by your health care provider.  Exercise regularly as told by your health care provider.  Keep all follow-up visits as told by your health care provider. This is important. PREVENTION  Avoid drinking beverages that contain caffeine or alcohol.  Avoid certain medicines, especially medicines that are used for breathing problems.  Avoid certain herbs and herbal medicines, such as those that contain ephedra or ginseng.  Do not use illegal drugs, such as cocaine and amphetamines.  Do not smoke.  Manage your high blood pressure. SEEK MEDICAL CARE IF:  You notice a change in the rate, rhythm, or strength of your heartbeat.  You are taking an anticoagulant and you  notice increased bruising.  You tire more easily when you exercise or exert yourself. SEEK IMMEDIATE MEDICAL CARE IF:  You have chest pain, abdominal pain, sweating, or weakness.  You feel nauseous.  You notice blood in your vomit, bowel movement, or urine.  You have shortness of breath.  You suddenly have swollen feet and ankles.  You feel dizzy.  You have sudden weakness or numbness of the face, arm, or leg, especially on one side of the body.  You have trouble speaking, trouble understanding, or both (aphasia).  Your face or your eyelid droops on one side. These symptoms may represent a serious problem that is an emergency. Do not wait to see if the symptoms will go away. Get medical help right away. Call your local emergency services (911 in the [place] Do not drive yourself to the hospital.   This information is not intended to replace advice given to you by your health care provider. Make sure you discuss any questions you have with your health care provider.   Document Released: 09/12/2005 Document Revised: 06/03/2015 Document Reviewed: 01/07/2015 Elsevier Interactive Patient Education [employer]

## 2016-08-15 ENCOUNTER — Ambulatory Visit (INDEPENDENT_AMBULATORY_CARE_PROVIDER_SITE_OTHER): Payer: PPO

## 2017-02-10 DIAGNOSIS — J069 Acute upper respiratory infection, unspecified: Secondary | ICD-10-CM | POA: Diagnosis not present

## 2017-08-16 NOTE — Telephone Encounter (Signed)
error 

## 2019-02-05 DIAGNOSIS — M25532 Pain in left wrist: Secondary | ICD-10-CM | POA: Diagnosis not present

## 2019-02-07 DIAGNOSIS — M25532 Pain in left wrist: Secondary | ICD-10-CM | POA: Diagnosis not present

## 2019-05-06 ENCOUNTER — Other Ambulatory Visit: Payer: Self-pay

## 2019-05-06 ENCOUNTER — Encounter (HOSPITAL_BASED_OUTPATIENT_CLINIC_OR_DEPARTMENT_OTHER): Payer: Self-pay | Admitting: Emergency Medicine

## 2019-05-06 ENCOUNTER — Emergency Department (HOSPITAL_BASED_OUTPATIENT_CLINIC_OR_DEPARTMENT_OTHER)
Admission: EM | Admit: 2019-05-06 | Discharge: 2019-05-06 | Disposition: A | Discharge status: Home / Self Care | Payer: PPO | Attending: Emergency Medicine | Admitting: Emergency Medicine

## 2019-05-06 ENCOUNTER — Emergency Department (HOSPITAL_BASED_OUTPATIENT_CLINIC_OR_DEPARTMENT_OTHER): Payer: PPO

## 2019-05-06 DIAGNOSIS — Y999 Unspecified external cause status: Secondary | ICD-10-CM | POA: Diagnosis not present

## 2019-05-06 DIAGNOSIS — S6992XA Unspecified injury of left wrist, hand and finger(s), initial encounter: Secondary | ICD-10-CM | POA: Diagnosis not present

## 2019-05-06 DIAGNOSIS — L03114 Cellulitis of left upper limb: Secondary | ICD-10-CM | POA: Insufficient documentation

## 2019-05-06 DIAGNOSIS — M7989 Other specified soft tissue disorders: Secondary | ICD-10-CM | POA: Diagnosis not present

## 2019-05-06 DIAGNOSIS — Y92009 Unspecified place in unspecified non-institutional (private) residence as the place of occurrence of the external cause: Secondary | ICD-10-CM | POA: Insufficient documentation

## 2019-05-06 DIAGNOSIS — S61512A Laceration without foreign body of left wrist, initial encounter: Secondary | ICD-10-CM | POA: Diagnosis not present

## 2019-05-06 DIAGNOSIS — Y93H3 Activity, building and construction: Secondary | ICD-10-CM | POA: Insufficient documentation

## 2019-05-06 DIAGNOSIS — M79642 Pain in left hand: Secondary | ICD-10-CM | POA: Diagnosis not present

## 2019-05-06 DIAGNOSIS — Z7982 Long term (current) use of aspirin: Secondary | ICD-10-CM | POA: Insufficient documentation

## 2019-05-06 DIAGNOSIS — Z23 Encounter for immunization: Secondary | ICD-10-CM | POA: Insufficient documentation

## 2019-05-06 DIAGNOSIS — W450XXA Nail entering through skin, initial encounter: Secondary | ICD-10-CM | POA: Insufficient documentation

## 2019-05-06 DIAGNOSIS — S61412A Laceration without foreign body of left hand, initial encounter: Secondary | ICD-10-CM | POA: Insufficient documentation

## 2019-05-06 MED ORDER — TETANUS-DIPHTH-ACELL PERTUSSIS 5-2.5-18.5 LF-MCG/0.5 IM SUSP
0.5000 mL | Freq: Once | INTRAMUSCULAR | Status: AC
  Administered 2019-05-06: 0.5 mL via INTRAMUSCULAR
  Filled 2019-05-06: qty 0.5

## 2019-05-06 MED ORDER — LIDOCAINE HCL (PF) 1 % IJ SOLN
INTRAMUSCULAR | Status: AC
  Administered 2019-05-06: 2.5 mL
  Filled 2019-05-06: qty 5

## 2019-05-06 MED ORDER — CEPHALEXIN 500 MG PO CAPS: 500.0000 mg | ORAL_CAPSULE | Freq: Four times a day (QID) | ORAL | 0 refills | Status: AC

## 2019-05-06 MED ORDER — CEFTRIAXONE SODIUM 1 G IJ SOLR
1.0000 g | Freq: Once | INTRAMUSCULAR | Status: AC
  Administered 2019-05-06: 1 g via INTRAMUSCULAR
  Filled 2019-05-06: qty 10

## 2019-05-06 NOTE — ED Notes (Signed)
ED Provider at bedside. 

## 2019-05-06 NOTE — ED Triage Notes (Addendum)
He smashed his hand against a piece of wood that contained nails yesterday. Pain, redness,  and swelling today.

## 2019-05-06 NOTE — ED Provider Notes (Signed)
[facility] EMERGENCY DEPARTMENT Provider Note   CSN: [id number] Arrival date & time: 05/06/19  [id number]     History   Chief Complaint Chief Complaint  Patient presents with  . Hand Injury    HPI Chase Kirk is a 68 y.o. male.     Patient is a 68 year old male with no significant past medical history.  He presents today for evaluation of left hand pain and swelling.  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  He was attempting to pry a board off of a doorway which slipped and struck his wrist.  He has 2 lacerations from a nail.  He is now having increased swelling and redness and presents for evaluation of this.  The history is provided by the patient.  Hand Injury Location:  Hand and wrist Hand location:  L hand Pain details:    Quality:  Aching   Radiates to:  Does not radiate   Severity:  Moderate   Onset quality:  Sudden   Timing:  Constant   Progression:  Worsening Relieved by:  Nothing Worsened by:  Nothing Ineffective treatments:  None tried   History reviewed. No pertinent past medical history.  There are no active problems to display for this patient.   History reviewed. No pertinent surgical history.      Home Medications    Prior to Admission medications   Medication Sig Start Date End Date Taking? Authorizing Provider  aspirin 81 MG tablet Take 81 mg by mouth daily.    [provider]  azithromycin (ZITHROMAX) 250 MG tablet Take 2 pills by mouth on day 1, then 1 pill by mouth per day on days 2 through 5. 12/20/15   Shade FloodGreene, Jeffrey R, MD    Family History Family History  Problem Relation Age of Onset  . Stroke Father     Social History Social History   Tobacco Use  . Smoking status: Never Smoker  . Smokeless tobacco: Never Used  Substance Use Topics  . Alcohol use: No    Alcohol/week: 0.0 standard drinks  . Drug use: No     Allergies   Patient has no known allergies.   Review of Systems Review of  Systems  All other systems reviewed and are negative.    Physical Exam Updated Vital Signs BP (!) 144/89 (BP Location: Right Arm)   Pulse (!) 105   Temp 98.6 F (37 C) (Oral)   Resp 18   Ht 6' (1.829 m)   Wt 113.4 kg   SpO2 97%   BMI 33.91 kg/m   Physical Exam Vitals signs and nursing note reviewed.  Constitutional:      General: He is not in acute distress.    Appearance: Normal appearance. He is not ill-appearing.  HENT:     Head: Normocephalic and atraumatic.  Pulmonary:     Effort: Pulmonary effort is normal.  Musculoskeletal:     Comments: The dorsum of the left wrist and hand have 1 laceration to the medial and lateral aspect.  There is some surrounding swelling, warmth, and mild erythema.  Distal PMS is intact.  See photo below.  Skin:    General: Skin is warm and dry.  Neurological:     Mental Status: He is alert and oriented to person, place, and time.          ED Treatments / Results  Labs (all labs ordered are listed, but only abnormal results are displayed) Labs Reviewed - No  data to display  EKG None  Radiology Dg Hand Complete Left  Result Date: 05/06/2019 CLINICAL DATA:  Injured hand yesterday. Pain, redness and swelling today. EXAM: LEFT HAND - COMPLETE 3+ VIEW COMPARISON:  None. FINDINGS: Moderate degenerative changes involving the hand and wrist, most notably at the [facility] joint of the thumb. There is significant joint space narrowing, spurring and subchondral cystic change. No acute fractures identified. There is fairly significant soft tissue swelling mainly along the dorsum of the hand and distal forearm. No radiopaque foreign body is identified. IMPRESSION: 1. Moderate degenerative changes, most significant at the [facility] joint of the thumb. 2. No acute fracture or radiopaque foreign body. Electronically Signed   By: Marijo Sanes M.D.   On: 05/06/2019 09:50    Procedures Procedures (including critical care time)  Medications Ordered in ED  Medications  cefTRIAXone (ROCEPHIN) injection 1 g (has no administration in time range)  Tdap (BOOSTRIX) injection 0.5 mL (has no administration in time range)     Initial Impression / Assessment and Plan / ED Course  I have reviewed the triage vital signs and the nursing notes.  Pertinent labs & imaging results that were available during my care of the patient were reviewed by me and considered in my medical decision making (see chart for details).  Patient's x-rays are negative, however the lacerations appear to be showing the early signs of infection.  Patient will be given IM Rocephin and discharged with Keflex.  Tetanus updated here.  Patient to perform local wound care and return to the ER if he develops increased redness, increased pain, purulent drainage, or other issues.  Final Clinical Impressions(s) / ED Diagnoses   Final diagnoses:  None    ED Discharge Orders    None       Veryl Speak, MD 05/06/19 1008

## 2019-05-06 NOTE — ED Notes (Signed)
Patient transported to X-ray 

## 2019-05-06 NOTE — Discharge Instructions (Addendum)
Begin taking Keflex as prescribed today.  Local wound care with bacitracin and dressing changes twice daily.  Return to the emergency department if you develop increased redness, increased pain, streaks up the arm, purulent drainage, high fever, or other new and concerning symptoms.

## 2019-05-06 NOTE — ED Notes (Signed)
Pt verbalized understanding of dc instructions.

## 2019-12-31 DIAGNOSIS — L821 Other seborrheic keratosis: Secondary | ICD-10-CM | POA: Diagnosis not present

## 2020-11-30 DIAGNOSIS — M21621 Bunionette of right foot: Secondary | ICD-10-CM | POA: Diagnosis not present

## 2020-11-30 DIAGNOSIS — M71571 Other bursitis, not elsewhere classified, right ankle and foot: Secondary | ICD-10-CM | POA: Diagnosis not present

## 2020-11-30 DIAGNOSIS — B070 Plantar wart: Secondary | ICD-10-CM | POA: Diagnosis not present

## 2020-12-14 DIAGNOSIS — M21621 Bunionette of right foot: Secondary | ICD-10-CM | POA: Diagnosis not present

## 2020-12-14 DIAGNOSIS — M71571 Other bursitis, not elsewhere classified, right ankle and foot: Secondary | ICD-10-CM | POA: Diagnosis not present

## 2021-04-16 IMAGING — CR LEFT HAND - COMPLETE 3+ VIEW
3 series · 3 of 3 positions shown · non-contrast
Comparison: None.

CLINICAL DATA: Injured hand yesterday. Pain, redness and swelling
today.

EXAM:
LEFT HAND - COMPLETE 3+ VIEW

[x hand pa left]
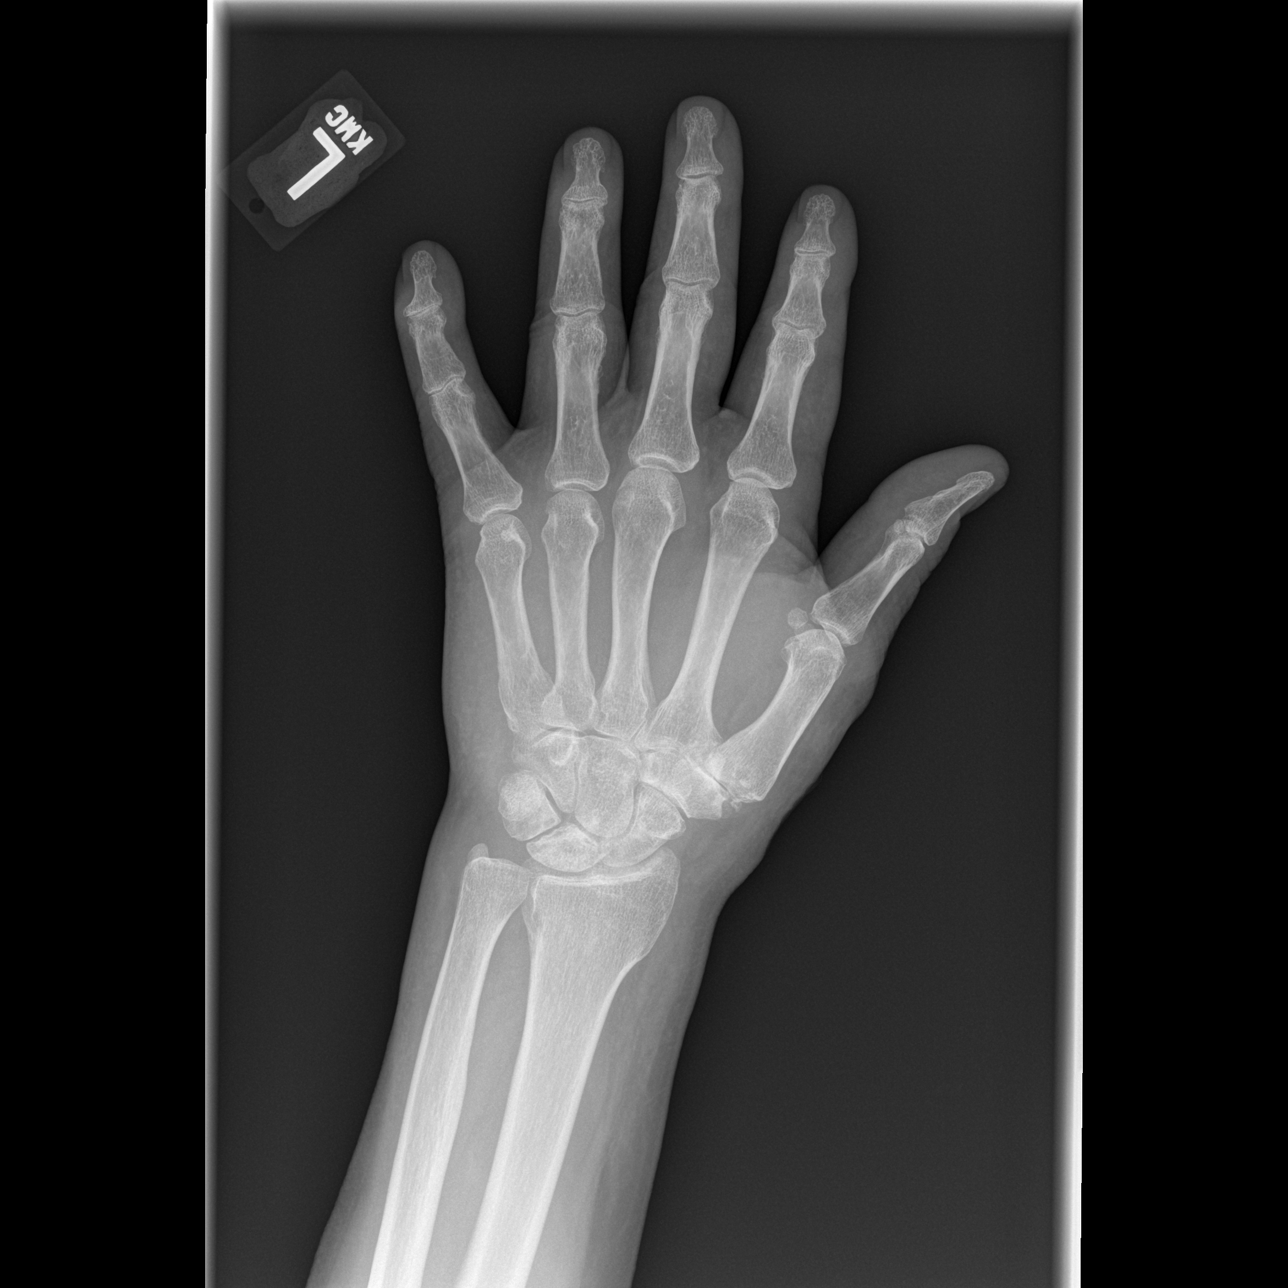

[x hand oblique left]
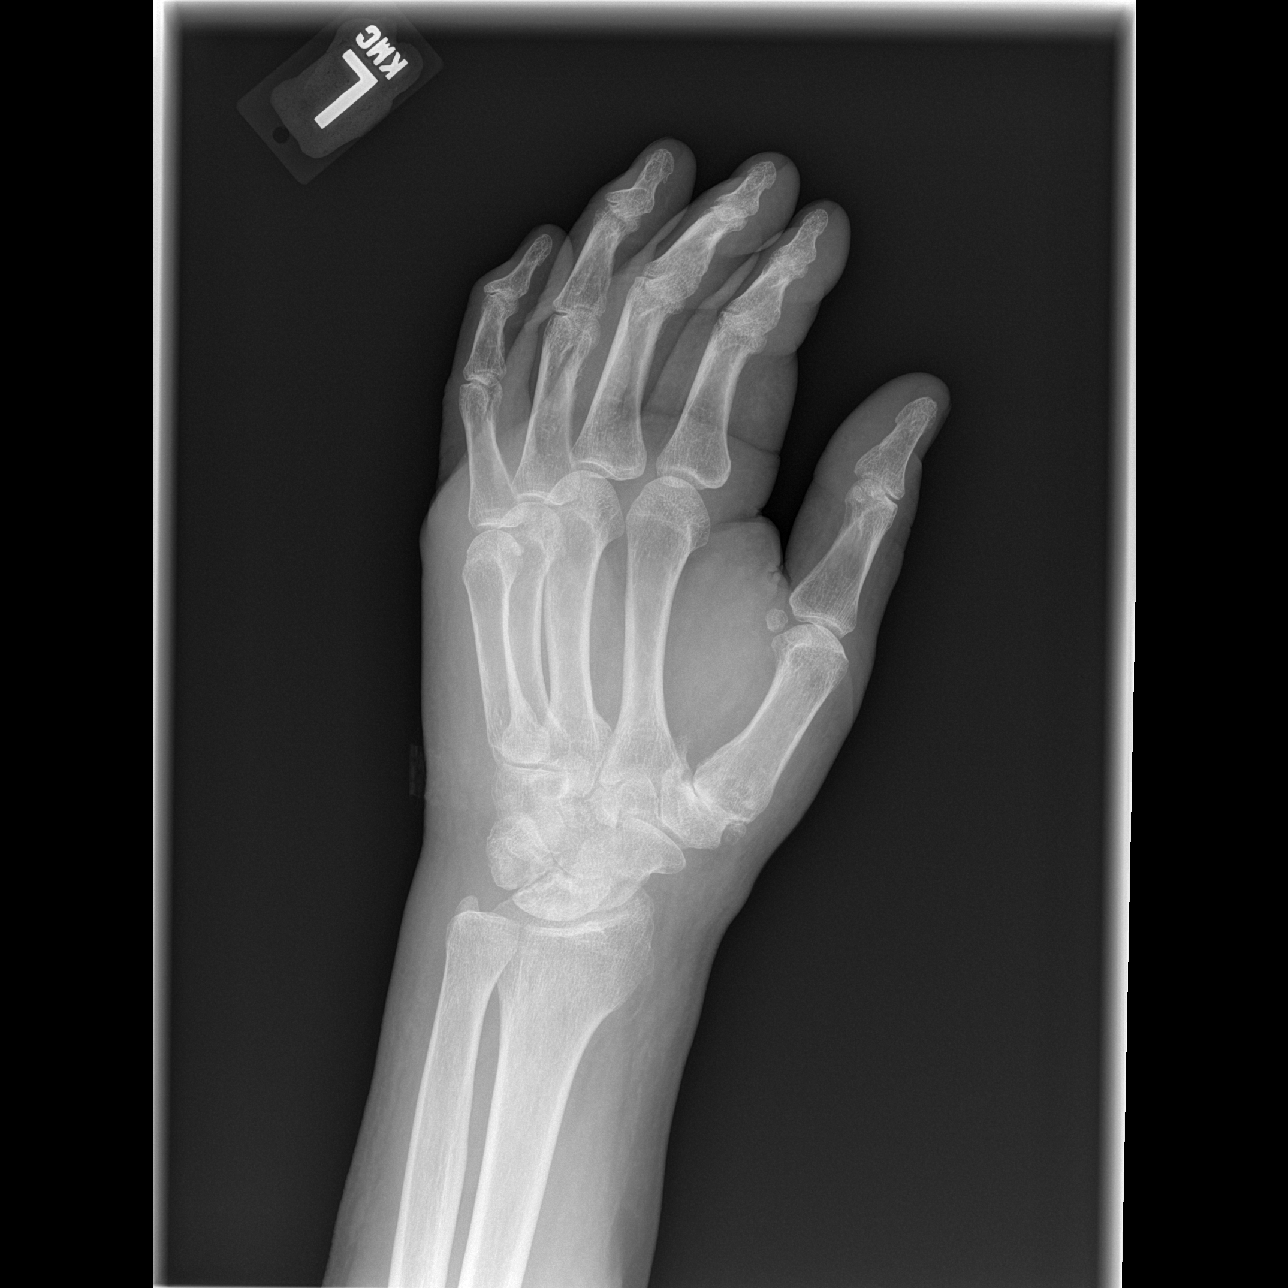

[x hand lat left]
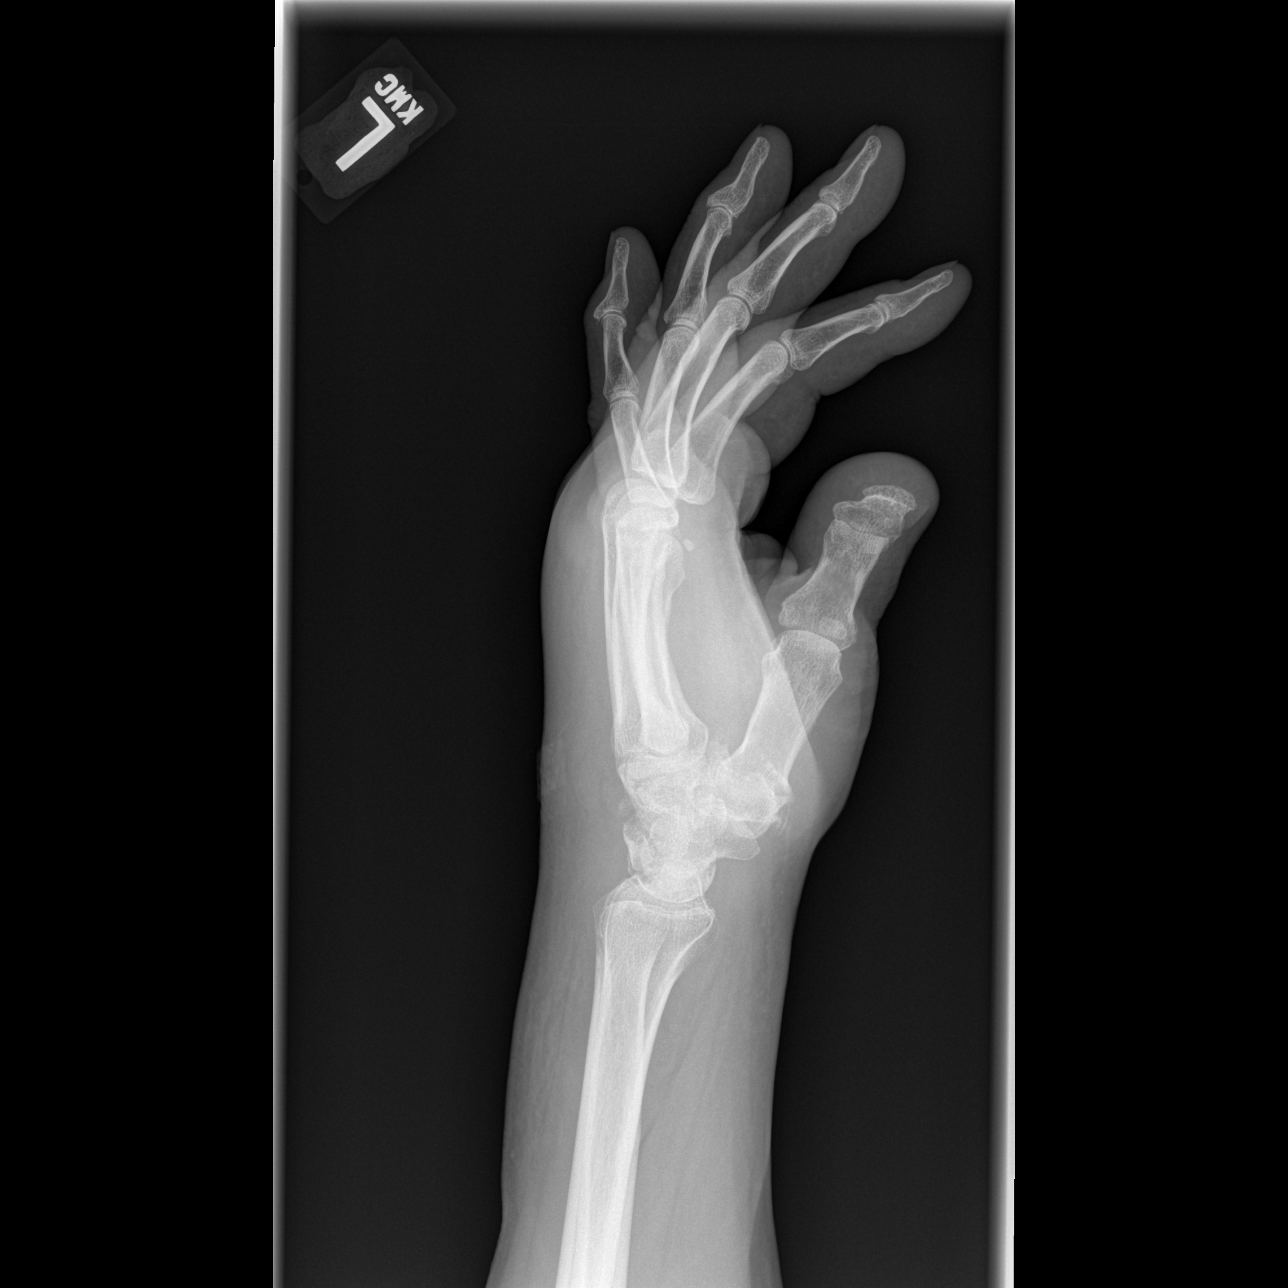

[3 of 3 positions shown; findings below may reference images not displayed]

FINDINGS: Moderate degenerative changes involving the hand and wrist, most
notably at the [facility] joint of the thumb. There is significant joint
space narrowing, spurring and subchondral cystic change. No acute
fractures identified. There is fairly significant soft tissue
swelling mainly along the dorsum of the hand and distal forearm. No
radiopaque foreign body is identified.
IMPRESSION: 1. Moderate degenerative changes, most significant at the [facility] joint
of the thumb.
2. No acute fracture or radiopaque foreign body.

## 2021-06-10 DIAGNOSIS — M21622 Bunionette of left foot: Secondary | ICD-10-CM | POA: Diagnosis not present

## 2021-06-10 DIAGNOSIS — M71572 Other bursitis, not elsewhere classified, left ankle and foot: Secondary | ICD-10-CM | POA: Diagnosis not present

## 2023-02-03 ENCOUNTER — Other Ambulatory Visit: Payer: Self-pay

## 2023-02-03 ENCOUNTER — Encounter (HOSPITAL_BASED_OUTPATIENT_CLINIC_OR_DEPARTMENT_OTHER): Payer: Self-pay | Admitting: Emergency Medicine

## 2023-02-03 ENCOUNTER — Emergency Department (HOSPITAL_BASED_OUTPATIENT_CLINIC_OR_DEPARTMENT_OTHER)
Admission: EM | Admit: 2023-02-03 | Discharge: 2023-02-03 | Discharge status: Against Medical Advice | Payer: Medicare HMO | Attending: Emergency Medicine | Admitting: Emergency Medicine

## 2023-02-03 DIAGNOSIS — Z5321 Procedure and treatment not carried out due to patient leaving prior to being seen by health care provider: Secondary | ICD-10-CM | POA: Insufficient documentation

## 2023-02-03 DIAGNOSIS — H6123 Impacted cerumen, bilateral: Secondary | ICD-10-CM | POA: Diagnosis not present

## 2023-02-03 DIAGNOSIS — H918X9 Other specified hearing loss, unspecified ear: Secondary | ICD-10-CM | POA: Insufficient documentation

## 2023-02-03 HISTORY — DX: Unspecified atrial flutter: I48.92

## 2023-02-03 NOTE — ED Triage Notes (Signed)
Bilateral ear fullness, feel clogged he said , per spouse he attempted to clean his ears using a tool , lost his hearing after that , no obvious injury or bleeding or drainage

## 2023-02-22 ENCOUNTER — Ambulatory Visit (INDEPENDENT_AMBULATORY_CARE_PROVIDER_SITE_OTHER): Payer: Medicare HMO | Admitting: Podiatry

## 2023-02-22 DIAGNOSIS — D2372 Other benign neoplasm of skin of left lower limb, including hip: Secondary | ICD-10-CM | POA: Diagnosis not present

## 2023-02-22 NOTE — Progress Notes (Signed)
   Chief Complaint  Patient presents with   Callouses    Patient came in today for bilateral callus, started 6 months ago, patient states its painful to walk,     Subjective: 72 y.o. male presenting to the office today as a new patient for evaluation of pain and tenderness associated to symptomatic skin lesions to the bilateral feet.  Ongoing for about 6 months.  He has seen a podiatrist in the past who debrided the area which seems to last for a prolonged period of time.   Past Medical History:  Diagnosis Date   Atrial fib/flutter, transient (HCC)     No past surgical history on file.  No Known Allergies   Objective:  Physical Exam General: Alert and oriented x3 in no acute distress  Dermatology: Hyperkeratotic lesion(s) present on the plantar aspect of the bilateral feet. Pain on palpation with a central nucleated core noted. Skin is warm, dry and supple bilateral lower extremities. Negative for open lesions or macerations.  Vascular: Palpable pedal pulses bilaterally. No edema or erythema noted. Capillary refill within normal limits.  Neurological: Epicritic and protective threshold grossly intact bilaterally.   Musculoskeletal Exam: Pain on palpation at the keratotic lesion(s) noted. Range of motion within normal limits bilateral. Muscle strength 5/5 in all groups bilateral.  Assessment: 1.  Symptomatic benign skin lesion   Plan of Care:  1. Patient evaluated 2. Excisional debridement of keratoic lesion(s) using a chisel blade was performed without incident.  Salicylic acid applied 3. Dressed area with light dressing.  Recommend OTC salicylic acid daily as needed 4. Patient is to return to the clinic PRN.   Felecia Shelling, DPM [facility]  Dr. Felecia Shelling, DPM    [address] [place], [place] [address]                Office [phone]  Fax [phone]

## 2023-03-17 DIAGNOSIS — H903 Sensorineural hearing loss, bilateral: Secondary | ICD-10-CM | POA: Diagnosis not present

## 2023-12-18 DIAGNOSIS — H6123 Impacted cerumen, bilateral: Secondary | ICD-10-CM | POA: Diagnosis not present
